# Patient Record
Sex: Female | Born: 1995
Health system: Southern US, Community
[De-identification: ages and names within clinical notes are randomized; demographics above are authoritative.]

## PROBLEM LIST (undated history)

## (undated) DIAGNOSIS — F988 Other specified behavioral and emotional disorders with onset usually occurring in childhood and adolescence: Secondary | ICD-10-CM

## (undated) DIAGNOSIS — J45909 Unspecified asthma, uncomplicated: Secondary | ICD-10-CM

## (undated) DIAGNOSIS — Z8489 Family history of other specified conditions: Secondary | ICD-10-CM

## (undated) DIAGNOSIS — S52181A Other fracture of upper end of right radius, initial encounter for closed fracture: Secondary | ICD-10-CM

## (undated) HISTORY — PX: WISDOM TOOTH EXTRACTION: SHX21

---
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
## 2017-03-27 DIAGNOSIS — Z6823 Body mass index (BMI) 23.0-23.9, adult: Secondary | ICD-10-CM | POA: Diagnosis not present

## 2017-03-27 DIAGNOSIS — Z01419 Encounter for gynecological examination (general) (routine) without abnormal findings: Secondary | ICD-10-CM | POA: Diagnosis not present

## 2017-03-27 DIAGNOSIS — Z113 Encounter for screening for infections with a predominantly sexual mode of transmission: Secondary | ICD-10-CM | POA: Diagnosis not present

## 2017-07-25 MED FILL — metroNIDAZOLE 0.75 % GEL: 0.75 | 5 days supply | Qty: 75 | Fill #0

## 2017-08-07 MED FILL — ADDERALL XR 20 MG CAP SA: 20 | 30 days supply | Qty: 60 | Fill #0

## 2017-09-16 MED FILL — ADDERALL XR 25 MG CAPSULE: 25 | 30 days supply | Qty: 60 | Fill #0

## 2017-12-04 MED FILL — ZOLPIDEM TARTRATE 5 MG TAB: 5 | 30 days supply | Qty: 30 | Fill #0

## 2017-12-04 MED FILL — ADDERALL XR 25 MG CAPSULE: 25 | 30 days supply | Qty: 60 | Fill #0

## 2018-05-06 MED FILL — MYDAYIS ER 25 MG CAPSULE: 25 | 30 days supply | Qty: 30 | Fill #0

## 2018-07-06 MED FILL — MYDAYIS ER 25 MG CAPSULE: 25 | 30 days supply | Qty: 30 | Fill #0

## 2018-09-23 MED FILL — MYDAYIS ER 25 MG CAPSULE: 25 | 30 days supply | Qty: 30 | Fill #0

## 2018-12-07 MED FILL — MYDAYIS ER 37.5 MG CAPSULE: 37.5 | 30 days supply | Qty: 30 | Fill #0

## 2019-02-22 MED FILL — MYDAYIS ER 37.5 MG CAPSULE: 37.5 | 30 days supply | Qty: 30 | Fill #0

## 2019-04-29 MED FILL — MYDAYIS ER 37.5 MG CAPSULE: 37.5 | 30 days supply | Qty: 30 | Fill #0

## 2019-07-19 MED FILL — FLUCONAZOLE 150 MG TABS: 150 | 1 days supply | Qty: 1 | Fill #0

## 2019-07-19 MED FILL — MYDAYIS ER 37.5 MG CAPSULE: 37.5 | 30 days supply | Qty: 30 | Fill #0

## 2019-09-01 MED FILL — MYDAYIS ER 37.5 MG CAPSULE: 37.5 | 30 days supply | Qty: 30 | Fill #0

## 2019-09-10 ENCOUNTER — Encounter (HOSPITAL_BASED_OUTPATIENT_CLINIC_OR_DEPARTMENT_OTHER): Payer: Self-pay | Admitting: Orthopaedic Surgery

## 2019-09-10 ENCOUNTER — Other Ambulatory Visit: Payer: Self-pay | Admitting: Orthopaedic Surgery

## 2019-09-10 ENCOUNTER — Other Ambulatory Visit: Payer: Self-pay

## 2019-09-10 ENCOUNTER — Ambulatory Visit
Admission: RE | Admit: 2019-09-10 | Discharge: 2019-09-10 | Disposition: A | Discharge status: Home / Self Care | Payer: No Typology Code available for payment source | Source: Ambulatory Visit | Attending: Orthopaedic Surgery | Admitting: Orthopaedic Surgery

## 2019-09-10 DIAGNOSIS — M25531 Pain in right wrist: Secondary | ICD-10-CM

## 2019-09-10 NOTE — Progress Notes (Addendum)
Spoke w/ via phone for pre-op interview---[facility] needs dos----     Urine poct         COVID test ------09-11-2019 at 1045 am Arrive at -------1130 am 09-15-2019 No food after midnight, clear liquids until 730 am then npo Medications to take morning of surgery -----none Diabetic medication -----n/a Patient Special Instructions ----- Pre-Op special Istructions ----- Patient verbalized understanding of instructions that were given at this phone interview. Patient denies shortness of breath, chest pain, fever, cough a this phone interview.

## 2019-09-11 ENCOUNTER — Other Ambulatory Visit (HOSPITAL_COMMUNITY)
Admission: RE | Admit: 2019-09-11 | Discharge: 2019-09-11 | Disposition: A | Discharge status: Home / Self Care | Payer: No Typology Code available for payment source | Source: Ambulatory Visit | Attending: Orthopaedic Surgery | Admitting: Orthopaedic Surgery

## 2019-09-11 DIAGNOSIS — Z01812 Encounter for preprocedural laboratory examination: Secondary | ICD-10-CM | POA: Diagnosis present

## 2019-09-11 DIAGNOSIS — Z20822 Contact with and (suspected) exposure to covid-19: Secondary | ICD-10-CM | POA: Insufficient documentation

## 2019-09-11 LAB — SARS CORONAVIRUS 2 (TAT 6-24 HRS): SARS Coronavirus 2: NEGATIVE

## 2019-09-15 ENCOUNTER — Encounter (HOSPITAL_BASED_OUTPATIENT_CLINIC_OR_DEPARTMENT_OTHER)
Admission: RE | Disposition: A | Discharge status: Home / Self Care | Payer: Self-pay | Source: Home / Self Care | Attending: Orthopaedic Surgery

## 2019-09-15 ENCOUNTER — Ambulatory Visit (HOSPITAL_BASED_OUTPATIENT_CLINIC_OR_DEPARTMENT_OTHER): Payer: No Typology Code available for payment source | Admitting: Anesthesiology

## 2019-09-15 ENCOUNTER — Encounter (HOSPITAL_BASED_OUTPATIENT_CLINIC_OR_DEPARTMENT_OTHER): Payer: Self-pay | Admitting: Orthopaedic Surgery

## 2019-09-15 ENCOUNTER — Ambulatory Visit (HOSPITAL_BASED_OUTPATIENT_CLINIC_OR_DEPARTMENT_OTHER)
Admission: RE | Admit: 2019-09-15 | Discharge: 2019-09-15 | Disposition: A | Discharge status: Home / Self Care | Payer: No Typology Code available for payment source | Attending: Orthopaedic Surgery | Admitting: Orthopaedic Surgery

## 2019-09-15 DIAGNOSIS — S52501A Unspecified fracture of the lower end of right radius, initial encounter for closed fracture: Secondary | ICD-10-CM | POA: Diagnosis present

## 2019-09-15 DIAGNOSIS — S52571A Other intraarticular fracture of lower end of right radius, initial encounter for closed fracture: Secondary | ICD-10-CM | POA: Insufficient documentation

## 2019-09-15 DIAGNOSIS — Y9323 Activity, snow (alpine) (downhill) skiing, snow boarding, sledding, tobogganing and snow tubing: Secondary | ICD-10-CM | POA: Diagnosis not present

## 2019-09-15 DIAGNOSIS — Z975 Presence of (intrauterine) contraceptive device: Secondary | ICD-10-CM | POA: Insufficient documentation

## 2019-09-15 DIAGNOSIS — J45909 Unspecified asthma, uncomplicated: Secondary | ICD-10-CM | POA: Diagnosis not present

## 2019-09-15 DIAGNOSIS — W19XXXA Unspecified fall, initial encounter: Secondary | ICD-10-CM | POA: Diagnosis not present

## 2019-09-15 HISTORY — DX: Unspecified asthma, uncomplicated: J45.909

## 2019-09-15 HISTORY — DX: Other specified behavioral and emotional disorders with onset usually occurring in childhood and adolescence: F98.8

## 2019-09-15 HISTORY — DX: Family history of other specified conditions: Z84.89

## 2019-09-15 HISTORY — PX: OPEN REDUCTION INTERNAL FIXATION (ORIF) DISTAL RADIAL FRACTURE: SHX5989

## 2019-09-15 HISTORY — DX: Other fracture of upper end of right radius, initial encounter for closed fracture: S52.181A

## 2019-09-15 LAB — POCT PREGNANCY, URINE: Preg Test, Ur: NEGATIVE

## 2019-09-15 SURGERY — OPEN REDUCTION INTERNAL FIXATION (ORIF) DISTAL RADIUS FRACTURE
Anesthesia: Regional | Site: Wrist | Laterality: Right

## 2019-09-15 MED ORDER — ONDANSETRON HCL 4 MG PO TABS: 4.0000 mg | ORAL_TABLET | Freq: Three times a day (TID) | ORAL | 1 refills | Status: AC | PRN

## 2019-09-15 MED ORDER — FENTANYL CITRATE (PF) 100 MCG/2ML IJ SOLN
100.0000 ug | Freq: Once | INTRAMUSCULAR | Status: AC
  Administered 2019-09-15: 100 ug via INTRAVENOUS
  Filled 2019-09-15: qty 2

## 2019-09-15 MED ORDER — MELOXICAM 7.5 MG PO TABS: 7.5000 mg | ORAL_TABLET | Freq: Every day | ORAL | 2 refills | Status: AC

## 2019-09-15 MED ORDER — MIDAZOLAM HCL 2 MG/2ML IJ SOLN
INTRAMUSCULAR | Status: AC
  Filled 2019-09-15: qty 2

## 2019-09-15 MED ORDER — PROPOFOL 10 MG/ML IV BOLUS
INTRAVENOUS | Status: DC | PRN
  Administered 2019-09-15 (×2): 20 mg via INTRAVENOUS

## 2019-09-15 MED ORDER — MEPERIDINE HCL 25 MG/ML IJ SOLN
6.2500 mg | INTRAMUSCULAR | Status: DC | PRN
  Filled 2019-09-15: qty 1

## 2019-09-15 MED ORDER — MIDAZOLAM HCL 5 MG/5ML IJ SOLN
INTRAMUSCULAR | Status: DC | PRN
  Administered 2019-09-15: 2 mg via INTRAVENOUS

## 2019-09-15 MED ORDER — OXYCODONE HCL 5 MG PO TABS: ORAL_TABLET | ORAL | 0 refills | Status: AC

## 2019-09-15 MED ORDER — PROPOFOL 500 MG/50ML IV EMUL
INTRAVENOUS | Status: DC | PRN
  Administered 2019-09-15: 75 ug/kg/min via INTRAVENOUS

## 2019-09-15 MED ORDER — ACETAMINOPHEN 500 MG PO TABS: 1000.0000 mg | ORAL_TABLET | Freq: Three times a day (TID) | ORAL | 0 refills | Status: AC

## 2019-09-15 MED ORDER — ROPIVACAINE HCL 5 MG/ML IJ SOLN
INTRAMUSCULAR | Status: DC | PRN
  Administered 2019-09-15: 30 mL

## 2019-09-15 MED ORDER — PROMETHAZINE HCL 25 MG/ML IJ SOLN
6.2500 mg | INTRAMUSCULAR | Status: DC | PRN
  Filled 2019-09-15: qty 1

## 2019-09-15 MED ORDER — CEFAZOLIN SODIUM-DEXTROSE 2-4 GM/100ML-% IV SOLN
2.0000 g | INTRAVENOUS | Status: AC
  Administered 2019-09-15: 2 g via INTRAVENOUS
  Filled 2019-09-15: qty 100

## 2019-09-15 MED ORDER — PROPOFOL 10 MG/ML IV BOLUS
INTRAVENOUS | Status: AC
  Filled 2019-09-15: qty 20

## 2019-09-15 MED ORDER — ONDANSETRON HCL 4 MG/2ML IJ SOLN
INTRAMUSCULAR | Status: DC | PRN
  Administered 2019-09-15: 4 mg via INTRAVENOUS

## 2019-09-15 MED ORDER — MIDAZOLAM HCL 2 MG/2ML IJ SOLN
2.0000 mg | Freq: Once | INTRAMUSCULAR | Status: AC
  Administered 2019-09-15: 2 mg via INTRAVENOUS
  Filled 2019-09-15: qty 2

## 2019-09-15 MED ORDER — CLONIDINE HCL (ANALGESIA) 100 MCG/ML EP SOLN
EPIDURAL | Status: DC | PRN
  Administered 2019-09-15: 100 ug

## 2019-09-15 MED ORDER — HYDROMORPHONE HCL 1 MG/ML IJ SOLN
0.2500 mg | INTRAMUSCULAR | Status: DC | PRN
  Filled 2019-09-15: qty 0.5

## 2019-09-15 MED ORDER — FENTANYL CITRATE (PF) 100 MCG/2ML IJ SOLN
INTRAMUSCULAR | Status: AC
  Filled 2019-09-15: qty 2

## 2019-09-15 MED ORDER — ACETAMINOPHEN 10 MG/ML IV SOLN
1000.0000 mg | Freq: Once | INTRAVENOUS | Status: DC | PRN
  Filled 2019-09-15: qty 100

## 2019-09-15 MED ORDER — LACTATED RINGERS IV SOLN
INTRAVENOUS | Status: DC
  Filled 2019-09-15: qty 1000

## 2019-09-15 MED ORDER — CHLORHEXIDINE GLUCONATE 4 % EX LIQD
60.0000 mL | Freq: Once | CUTANEOUS | Status: DC
  Filled 2019-09-15: qty 118

## 2019-09-15 MED ORDER — CEFAZOLIN SODIUM-DEXTROSE 2-4 GM/100ML-% IV SOLN
INTRAVENOUS | Status: AC
  Filled 2019-09-15: qty 100

## 2019-09-15 MED ORDER — HYDROCODONE-ACETAMINOPHEN 7.5-325 MG PO TABS
1.0000 | ORAL_TABLET | Freq: Once | ORAL | Status: DC | PRN
  Filled 2019-09-15: qty 1

## 2019-09-15 MED FILL — ONDANSETRON HCL 4 MG TABLET: 4 | 4 days supply | Qty: 10 | Fill #0

## 2019-09-15 MED FILL — oxyCODONE HCL 5 MG TABS: 5 | 5 days supply | Qty: 30 | Fill #0

## 2019-09-15 MED FILL — MELOXICAM 7.5 MG TABLET: 7.5 | 30 days supply | Qty: 30 | Fill #0

## 2019-09-15 SURGICAL SUPPLY — 73 items
APL PRP STRL LF DISP 70% ISPRP (MISCELLANEOUS)
APL SKNCLS STERI-STRIP NONHPOA (GAUZE/BANDAGES/DRESSINGS)
BANDAGE ACE 3X5.8 VEL STRL LF (GAUZE/BANDAGES/DRESSINGS) ×2 IMPLANT
BENZOIN TINCTURE PRP APPL 2/3 (GAUZE/BANDAGES/DRESSINGS) IMPLANT
BIT DRILL 2.2 SS TIBIAL (BIT) ×1 IMPLANT
BLADE HEX COATED 2.75 (ELECTRODE) IMPLANT
BLADE SURG 15 STRL LF DISP TIS (BLADE) ×1 IMPLANT
BLADE SURG 15 STRL SS (BLADE) ×2
BNDG CMPR 9X4 STRL LF SNTH (GAUZE/BANDAGES/DRESSINGS)
BNDG COHESIVE 4X5 TAN STRL (GAUZE/BANDAGES/DRESSINGS) IMPLANT
BNDG ELASTIC 4X5.8 VLCR STR LF (GAUZE/BANDAGES/DRESSINGS) ×2 IMPLANT
BNDG ESMARK 4X9 LF (GAUZE/BANDAGES/DRESSINGS) IMPLANT
BNDG GAUZE ELAST 4 BULKY (GAUZE/BANDAGES/DRESSINGS) ×1 IMPLANT
BRUSH SCRUB EZ PLAIN DRY (MISCELLANEOUS) ×2 IMPLANT
CANISTER SUCT 1200ML W/VALVE (MISCELLANEOUS) IMPLANT
CHLORAPREP W/TINT 26 (MISCELLANEOUS) ×1 IMPLANT
COVER BACK TABLE 60X90IN (DRAPES) ×2 IMPLANT
COVER WAND RF STERILE (DRAPES) ×2 IMPLANT
CUFF TOURN SGL QUICK 18X4 (TOURNIQUET CUFF) ×1 IMPLANT
DECANTER SPIKE VIAL GLASS SM (MISCELLANEOUS) IMPLANT
DRAPE EXTREMITY T 121X128X90 (DISPOSABLE) ×2 IMPLANT
DRAPE IMP U-DRAPE 54X76 (DRAPES) IMPLANT
DRAPE INCISE IOBAN 66X45 STRL (DRAPES) IMPLANT
DRAPE OEC MINIVIEW 54X84 (DRAPES) IMPLANT
DRAPE SURG 17X23 STRL (DRAPES) ×2 IMPLANT
DRSG EMULSION OIL 3X3 NADH (GAUZE/BANDAGES/DRESSINGS) IMPLANT
DRSG PAD ABDOMINAL 8X10 ST (GAUZE/BANDAGES/DRESSINGS) ×1 IMPLANT
ELECT REM PT RETURN 9FT ADLT (ELECTROSURGICAL) ×2
ELECTRODE REM PT RTRN 9FT ADLT (ELECTROSURGICAL) ×1 IMPLANT
GAUZE SPONGE 4X4 12PLY STRL (GAUZE/BANDAGES/DRESSINGS) ×2 IMPLANT
GAUZE SPONGE 4X4 8PLY STR LF (GAUZE/BANDAGES/DRESSINGS) ×1 IMPLANT
GLOVE BIOGEL PI IND STRL 8 (GLOVE) ×1 IMPLANT
GLOVE BIOGEL PI INDICATOR 8 (GLOVE) ×1
GLOVE ECLIPSE 8.0 STRL XLNG CF (GLOVE) ×2 IMPLANT
GOWN STRL REUS W/ TWL LRG LVL3 (GOWN DISPOSABLE) ×1 IMPLANT
GOWN STRL REUS W/ TWL XL LVL3 (GOWN DISPOSABLE) ×1 IMPLANT
GOWN STRL REUS W/TWL LRG LVL3 (GOWN DISPOSABLE) ×2
GOWN STRL REUS W/TWL XL LVL3 (GOWN DISPOSABLE) ×2
K-WIRE 1.6 (WIRE) ×4
K-WIRE FX5X1.6XNS BN SS (WIRE) ×2
KWIRE FX5X1.6XNS BN SS (WIRE) IMPLANT
NDL HYPO 25X1 1.5 SAFETY (NEEDLE) IMPLANT
NEEDLE HYPO 25X1 1.5 SAFETY (NEEDLE) IMPLANT
NS IRRIG 1000ML POUR BTL (IV SOLUTION) IMPLANT
PACK BASIN DAY SURGERY FS (CUSTOM PROCEDURE TRAY) ×2 IMPLANT
PAD CAST 4YDX4 CTTN HI CHSV (CAST SUPPLIES) ×1 IMPLANT
PADDING CAST COTTON 4X4 STRL (CAST SUPPLIES) ×2
PEG LOCKING SMOOTH 2.2X16 (Screw) ×1 IMPLANT
PEG LOCKING SMOOTH 2.2X18 (Peg) ×1 IMPLANT
PEG LOCKING SMOOTH 2.2X20 (Screw) ×2 IMPLANT
PEG LOCKING SMOOTH 2.2X22 (Screw) ×1 IMPLANT
PENCIL BUTTON HOLSTER BLD 10FT (ELECTRODE) ×2 IMPLANT
PLATE NARROW DVR RIGHT (Plate) ×1 IMPLANT
SCREW LOCK 14X2.7X 3 LD TPR (Screw) IMPLANT
SCREW LOCKING 2.7X13MM (Screw) ×1 IMPLANT
SCREW LOCKING 2.7X14 (Screw) ×4 IMPLANT
SCREW LP NL 2.7X22MM (Screw) ×1 IMPLANT
SPLINT PLASTER CAST XFAST 3X15 (CAST SUPPLIES) ×10 IMPLANT
SPLINT PLASTER XTRA FASTSET 3X (CAST SUPPLIES) ×10
SPONGE LAP 4X18 RFD (DISPOSABLE) ×2 IMPLANT
STOCKINETTE 4X48 STRL (DRAPES) ×2 IMPLANT
STRIP CLOSURE SKIN 1/2X4 (GAUZE/BANDAGES/DRESSINGS) ×1 IMPLANT
SUCTION FRAZIER HANDLE 10FR (MISCELLANEOUS) ×2
SUCTION TUBE FRAZIER 10FR DISP (MISCELLANEOUS) IMPLANT
SUT MNCRL AB 4-0 PS2 18 (SUTURE) ×1 IMPLANT
SUT VIC AB 3-0 SH 27 (SUTURE) ×2
SUT VIC AB 3-0 SH 27X BRD (SUTURE) IMPLANT
SYR BULB 3OZ (MISCELLANEOUS) ×2 IMPLANT
SYR CONTROL 10ML LL (SYRINGE) ×2 IMPLANT
TOWEL OR 17X26 10 PK STRL BLUE (TOWEL DISPOSABLE) ×2 IMPLANT
TRAY DSU PREP LF (CUSTOM PROCEDURE TRAY) IMPLANT
TUBE CONNECTING 12X1/4 (SUCTIONS) IMPLANT
YANKAUER SUCT BULB TIP NO VENT (SUCTIONS) IMPLANT

## 2019-09-15 NOTE — Transfer of Care (Signed)
Immediate Anesthesia Transfer of Care Note  Patient: Tanya Howard  Procedure(s) Performed: OPEN REDUCTION INTERNAL FIXATION (ORIF) DISTAL RADIAL FRACTURE (Right Wrist)  Patient Location: PACU  Anesthesia Type:MAC combined with regional for post-op pain  Level of Consciousness: awake, oriented, patient cooperative and responds to stimulation  Airway & Oxygen Therapy: Patient Spontanous Breathing and Patient connected to nasal cannula oxygen  Post-op Assessment: Report given to RN and Post -op Vital signs reviewed and stable  Post vital signs: Reviewed and stable  Last Vitals:  Vitals Value Taken Time  BP 99/55 09/15/19 1539  Temp 36.5 C 09/15/19 1539  Pulse 53 09/15/19 1540  Resp 12 09/15/19 1540  SpO2 100 % 09/15/19 1540  Vitals shown include unvalidated device data.  Last Pain:  Vitals:   09/15/19 1330  TempSrc:   PainSc: 0-No pain      Patients Stated Pain Goal: 4 (59/47/07 6151)  Complications: No apparent anesthesia complications

## 2019-09-15 NOTE — Anesthesia Postprocedure Evaluation (Signed)
Anesthesia Post Note  Patient: Tanya Howard  Procedure(s) Performed: OPEN REDUCTION INTERNAL FIXATION (ORIF) DISTAL RADIAL FRACTURE (Right Wrist)     Patient location during evaluation: PACU Anesthesia Type: Regional Level of consciousness: awake and alert Pain management: pain level controlled Vital Signs Assessment: post-procedure vital signs reviewed and stable Respiratory status: spontaneous breathing, nonlabored ventilation, respiratory function stable and patient connected to nasal cannula oxygen Cardiovascular status: stable and blood pressure returned to baseline Postop Assessment: no apparent nausea or vomiting Anesthetic complications: no    Last Vitals:  Vitals:   09/15/19 1330 09/15/19 1539  BP: (!) 103/55 (!) 99/55  Pulse: (!) 55 68  Resp: 15 15  Temp:  36.5 C  SpO2: 100% 100%    Last Pain:  Vitals:   09/15/19 1539  TempSrc:   PainSc: 0-No pain                 Trevor Iha

## 2019-09-15 NOTE — Op Note (Signed)
Orthopaedic Surgery Operative Note (CSN: [id number])  Tanya Howard  March 28, 1996 Date of Surgery: 09/15/2019   Diagnoses:  RIGHT DISTAL RADIUS FRACTURE  Procedure: Right distal radius fracture open reduction internal fixation intra-articular less than 3 pieces   Operative Finding Successful completion of the planned procedure.  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  Overall case went routine, no obvious bleeding at the end of the case and good palpable pulse noted.  Post-operative plan: The patient will be nonweightbearing in a splint for a week transition to a removable splint for likely 3 weeks and out full weightbearing to tolerance at that point.  If the patient is doing quite well she could actually go back to weightbearing to tolerance in her brace at week 3 we will discuss this further.  The patient will be discharged home.  DVT prophylaxis not indicated in this ambulatory upper extremity patient without significant risk factors.   Pain control with PRN pain medication preferring oral medicines.  Follow up plan will be scheduled in approximately 7 days for incision check and XR.  Post-Op Diagnosis: Same Surgeons:Primary: Hiram Gash, MD Assistants:Caroline McBane PA-C Location: [facility] OR ROOM 3 Anesthesia: General with regional anesthesia Antibiotics: Ancef 2 g with local vancomycin powder 1 g at the surgical site Tourniquet time:  Total Tourniquet Time Documented: Upper Arm (Right) - 20 minutes Total: Upper Arm (Right) - 20 minutes  Estimated Blood Loss: Minimal Complications: None Specimens: None Implants: Implant Name Type Inv. Item Serial No. Manufacturer Lot No. LRB No. Used Action  PLATE NARROW DVR RIGHT - [id number] Plate PLATE NARROW DVR RIGHT  ZIMMER  RECON(ORTH,TRAU,BIO,SG)  Right 1 Implanted    Indications for Surgery:   Tanya Howard is a 24 y.o. female with fall whilst snowboarding resulting in a intra-articular distal radius fracture with minimal displacement on CT scan.  We did have a long discussion about her options including casting which would have at least kept her out of work for 6 weeks and likely more along the lines of 8-10 after she was able to get her motion back versus operative fixation which may return to work around 3 to 4 weeks.  She like to proceed with surgery.  She understood the risks and increased possibility of infection, nerve damage or vessel damage, CRPS with operative versus nonoperative treatment.    Procedure:   The patient was identified properly. Informed consent was obtained and the surgical site was marked. The patient was taken up to suite where general anesthesia was induced.  The patient was positioned supine on a hand table.  The right wrist was prepped and draped in the usual sterile fashion.  Timeout was performed before the beginning of the case.  Tourniquet was used for the above duration.  An FCR approach was made exposing the volar surface of the distal radius taking care to go through the sheath of the FCR tendon tract and ulnarly exposing the inferior portion of the sheath while protecting the median nerve and radial artery on each side with blunt retractors.  This inferior portion of the sheath was incised sharply and examined for presence of the palmar cutaneous branch of the median nerve.  It was determined to not be within the field and we carried our dissection deeply to the bone  splitting the pronator quadratus and exposing the fracture site.    Appropriate reduction was obtained and a narrow DVR Biomet plate was placed and checked for sizing and reduction under fluoroscopy.  This reduction was held in place with K wires and a K wire was placed into the radial styloid.    Once  appropriate reduction was confirmed we then proceeded to fix the plate proximally and then proceeded to fill the distal holes with a combination of partially threaded screws and pegs.  At this point we checked our reduction to ensure that there was no intra-articular extension of our screws.  Once this was confirmed we proceeded to fill remaining 3 proximal shaft screws and obtained final images which demonstrated appropriate reduction and maintenance of alignment.  The DRUJ was checked and found to be stable.  We verified that all fast guides were removed on XR and through count.    The wound was thoroughly irrigated.  The tourniquet was released prior to skin closure to verify there was no excessive bleeding and we visualized that the radial artery and median nerve were intact at the end of the case. The PQ was reapproximated grossly prior to skin closure.     We irrigated the wound copiously before placing local antibiotic as listed above.  Close the incision in a multilayer fashion with absorbable suture.  Sterile dressing was placed.  A demisplint was placed.  Patient was awoken taken to PACU in stable condition.  Alfonse Alpers, PA-C, present and scrubbed throughout the case, critical for completion in a timely fashion, and for retraction, instrumentation, closure.

## 2019-09-15 NOTE — Anesthesia Preprocedure Evaluation (Addendum)
Anesthesia Evaluation  Patient identified by MRN, date of birth, ID band Patient awake    Reviewed: Allergy & Precautions, NPO status , Patient's Chart, lab work & pertinent test results  History of Anesthesia Complications (+) Family history of anesthesia reaction  Airway Mallampati: I  TM Distance: >3 FB Neck ROM: Full    Dental no notable dental hx. (+) Teeth Intact, Dental Advisory Given   Pulmonary    Pulmonary exam normal breath sounds clear to auscultation       Cardiovascular Exercise Tolerance: Good Normal cardiovascular exam Rhythm:Regular Rate:Normal     Neuro/Psych negative neurological ROS     GI/Hepatic negative GI ROS, Neg liver ROS,   Endo/Other  negative endocrine ROS  Renal/GU negative Renal ROS     Musculoskeletal negative musculoskeletal ROS (+)   Abdominal   Peds  Hematology   Anesthesia Other Findings   Reproductive/Obstetrics negative OB ROS                           Anesthesia Physical Anesthesia Plan  ASA: I  Anesthesia Plan: Regional   Post-op Pain Management:    Induction: Intravenous  PONV Risk Score and Plan: 3 and Treatment may vary due to age or medical condition, Ondansetron and Midazolam  Airway Management Planned: Nasal Cannula and Natural Airway  Additional Equipment:   Intra-op Plan:   Post-operative Plan:   Informed Consent: I have reviewed the patients History and Physical, chart, labs and discussed the procedure including the risks, benefits and alternatives for the proposed anesthesia with the patient or authorized representative who has indicated his/her understanding and acceptance.     Dental advisory given  Plan Discussed with: CRNA  Anesthesia Plan Comments: (ORIF of R radial fx.  Under R supra clavicular block + MAC)       Anesthesia Quick Evaluation

## 2019-09-15 NOTE — Discharge Instructions (Signed)

## 2019-09-15 NOTE — Anesthesia Procedure Notes (Addendum)
Anesthesia Regional Block: Supraclavicular block   Pre-Anesthetic Checklist: ,, timeout performed, Correct Patient, Correct Site, Correct Laterality, Correct Procedure, Correct Position, site marked, Risks and benefits discussed,  Surgical consent,  Pre-op evaluation,  At surgeon's request and post-op pain management  Laterality: Right  Prep: chloraprep       Needles:  Injection technique: Single-shot  Needle Type: Echogenic Needle     Needle Length: 5cm  Needle Gauge: 21     Additional Needles:   Procedures:,,,, ultrasound used (permanent image in chart),,,,  Narrative:  Start time: 09/15/2019 12:15 PM End time: 09/15/2019 12:25 PM Injection made incrementally with aspirations every 5 mL.  Performed by: Personally  Anesthesiologist: Trevor Iha, MD  Additional Notes: Pt tolerated procedure well

## 2019-09-15 NOTE — Progress Notes (Signed)
Assisted Dr. Richardson Landry with right, supraclavicular block. Side rails up, monitors on throughout procedure. See vital signs in flow sheet. Tolerated Procedure well.

## 2019-09-15 NOTE — H&P (Signed)
PREOPERATIVE H&P  Chief Complaint: RIGHT DISTAL RADIUS FRACTURE  HPI: Tanya Howard is a 24 y.o. female who is scheduled for OPEN REDUCTION INTERNAL FIXATION (ORIF) DISTAL RADIAL FRACTURE.   Patient sustained a right distal radius fracture.   Her symptoms are rated as moderate to severe, and have been worsening.  This is significantly impairing activities of daily living.    Please see clinic note for further details on this patient's care.    She has elected for surgical management.   Past Medical History:  Diagnosis Date  . ADD (attention deficit disorder)   . Asthma    childhood asthma  . Family history of adverse reaction to anesthesia    grandfather ponv  . Other fracture of upper end of right radius, initial encounter for closed fracture    Past Surgical History:  Procedure Laterality Date  . WISDOM TOOTH EXTRACTION     Social History   Socioeconomic History  . Marital status: Single    Spouse name: Not on file  . Number of children: Not on file  . Years of education: Not on file  . Highest education level: Not on file  Occupational History  . Not on file  Tobacco Use  . Smoking status: Never Smoker  . Smokeless tobacco: Never Used  Substance and Sexual Activity  . Alcohol use: Yes    Comment: occ  . Drug use: Never  . Sexual activity: Not on file  Other Topics Concern  . Not on file  Social History Narrative  . Not on file   Social Determinants of Health   Financial Resource Strain:   . Difficulty of Paying Living Expenses:   Food Insecurity:   . Worried About Charity fundraiser in the Last Year:   . Arboriculturist in the Last Year:   Transportation Needs:   . Film/video editor (Medical):   Marland Kitchen Lack of Transportation (Non-Medical):   Physical Activity:   . Days of Exercise per Week:   . Minutes of Exercise per Session:   Stress:   . Feeling of Stress :   Social Connections:   . Frequency of Communication with Friends and  Family:   . Frequency of Social Gatherings with Friends and Family:   . Attends Religious Services:   . Active Member of Clubs or Organizations:   . Attends Archivist Meetings:   Marland Kitchen Marital Status:    History reviewed. No pertinent family history. No Known Allergies Prior to Admission medications   Medication Sig Start Date End Date Taking? Authorizing Provider  acetaminophen (TYLENOL) 500 MG tablet Take 1,000 mg by mouth every 6 (six) hours as needed.   Yes [provider]  calcium gluconate 500 MG tablet Take 1 tablet by mouth daily. Takes 2 tabs   Yes [provider]  cholecalciferol (VITAMIN D3) 25 MCG (1000 UNIT) tablet Take 1,000 Units by mouth daily. Takes 2 tabs   Yes [provider]  UNABLE TO FIND 1 scoop collagen powder daily   Yes [provider]  Amphet-Dextroamphet 3-Bead ER (MYDAYIS) 37.5 MG CP24 Take by mouth. Takes when working    [provider]  levonorgestrel (MIRENA) 20 MCG/24HR IUD 1 each by Intrauterine route once.    [provider]    ROS: All other systems have been reviewed and were otherwise negative with the exception of those mentioned in the HPI and as above.  Physical Exam: General: Alert, no acute  distress Cardiovascular: No pedal edema Respiratory: No cyanosis, no use of accessory musculature GI: No organomegaly, abdomen is soft and non-tender Skin: No lesions in the area of chief complaint Neurologic: Sensation intact distally Psychiatric: Patient is competent for consent with normal mood and affect Lymphatic: No axillary or cervical lymphadenopathy  MUSCULOSKELETAL:  Right upper extremity: Splint CDI. Skin intact though cannot assess fully beneath splint. Nontender to palpation proximally, with full and painless ROM throughout hand with DPC of 0. + Motor in  AIN, PIN, Ulnar distributions. Sensation intact in medial, radial, and ulnar distributions. Well perfused digits.   Imaging: CT  of right wrist: Acute nondisplaced intra-articular fracture of the distal radius.  Assessment: RIGHT DISTAL RADIUS FRACTURE  Plan: Plan for Procedure(s): OPEN REDUCTION INTERNAL FIXATION (ORIF) DISTAL RADIAL FRACTURE  The risks benefits and alternatives were discussed with the patient including but not limited to the risks of nonoperative treatment, versus surgical intervention including infection, bleeding, nerve injury,  blood clots, cardiopulmonary complications, morbidity, mortality, among others, and they were willing to proceed.   The patient acknowledged the explanation, agreed to proceed with the plan and consent was signed.   Operative Plan: ORIF right distal radius fracture Discharge Medications: Standard   Vernetta Honey, PA-C  09/15/2019 12:17 PM

## 2019-09-15 NOTE — H&P (Deleted)
PREOPERATIVE H&P  Chief Complaint: RIGHT DISTAL RADIUS FRACTURE  HPI: Tanya Howard is a 24 y.o. female who presents for preoperative history and physical with a diagnosis of RIGHT DISTAL RADIUS FRACTURE. Symptoms are rated as moderate to severe, and have been worsening.  This is significantly impairing activities of daily living.  Please see my clinic note for full details on this patient's care.  She has elected for surgical management.   Past Medical History:  Diagnosis Date  . ADD (attention deficit disorder)   . Asthma    childhood asthma  . Family history of adverse reaction to anesthesia    grandfather ponv  . Other fracture of upper end of right radius, initial encounter for closed fracture    Past Surgical History:  Procedure Laterality Date  . WISDOM TOOTH EXTRACTION     Social History   Socioeconomic History  . Marital status: Single    Spouse name: Not on file  . Number of children: Not on file  . Years of education: Not on file  . Highest education level: Not on file  Occupational History  . Not on file  Tobacco Use  . Smoking status: Never Smoker  . Smokeless tobacco: Never Used  Substance and Sexual Activity  . Alcohol use: Yes    Comment: occ  . Drug use: Never  . Sexual activity: Not on file  Other Topics Concern  . Not on file  Social History Narrative  . Not on file   Social Determinants of Health   Financial Resource Strain:   . Difficulty of Paying Living Expenses:   Food Insecurity:   . Worried About Programme researcher, broadcasting/film/video in the Last Year:   . Barista in the Last Year:   Transportation Needs:   . Freight forwarder (Medical):   Marland Kitchen Lack of Transportation (Non-Medical):   Physical Activity:   . Days of Exercise per Week:   . Minutes of Exercise per Session:   Stress:   . Feeling of Stress :   Social Connections:   . Frequency of Communication with Friends and Family:   . Frequency of Social Gatherings with Friends and  Family:   . Attends Religious Services:   . Active Member of Clubs or Organizations:   . Attends Banker Meetings:   Marland Kitchen Marital Status:    History reviewed. No pertinent family history. No Known Allergies Prior to Admission medications   Medication Sig Start Date End Date Taking? Authorizing Provider  acetaminophen (TYLENOL) 500 MG tablet Take 1,000 mg by mouth every 6 (six) hours as needed.   Yes [provider]  calcium gluconate 500 MG tablet Take 1 tablet by mouth daily. Takes 2 tabs   Yes [provider]  cholecalciferol (VITAMIN D3) 25 MCG (1000 UNIT) tablet Take 1,000 Units by mouth daily. Takes 2 tabs   Yes [provider]  UNABLE TO FIND 1 scoop collagen powder daily   Yes [provider]  Amphet-Dextroamphet 3-Bead ER (MYDAYIS) 37.5 MG CP24 Take by mouth. Takes when working    [provider]  levonorgestrel (MIRENA) 20 MCG/24HR IUD 1 each by Intrauterine route once.    [provider]     Positive ROS: All other systems have been reviewed and were otherwise negative with the exception of those mentioned in the HPI and as above.  Physical Exam: General: Alert, no acute distress Cardiovascular: No pedal edema Respiratory: No cyanosis, no use of accessory  musculature GI: No organomegaly, abdomen is soft and non-tender Skin: No lesions in the area of chief complaint Neurologic: Sensation intact distally Psychiatric: Patient is competent for consent with normal mood and affect Lymphatic: No axillary or cervical lymphadenopathy  MUSCULOSKELETAL: R wrist, wwp hand, DPC 0, skin intact, ROM deferred insetting of known fracture  Assessment: RIGHT DISTAL RADIUS FRACTURE  Plan: Plan for Procedure(s): OPEN REDUCTION INTERNAL FIXATION (ORIF) DISTAL RADIAL FRACTURE  The risks benefits and alternatives were discussed with the patient including but not limited to the risks of nonoperative treatment, versus surgical  intervention including infection, bleeding, nerve injury,  blood clots, cardiopulmonary complications, morbidity, mortality, among others, and they were willing to proceed.   Hiram Gash, MD  09/15/2019 12:16 PM

## 2019-09-15 NOTE — Interval H&P Note (Signed)
History and Physical Interval Note:  09/15/2019 12:19 PM  Tanya Howard  has presented today for surgery, with the diagnosis of RIGHT DISTAL RADIUS FRACTURE.  The various methods of treatment have been discussed with the patient and family. After consideration of risks, benefits and other options for treatment, the patient has consented to  Procedure(s): OPEN REDUCTION INTERNAL FIXATION (ORIF) DISTAL RADIAL FRACTURE (Right) as a surgical intervention.  The patient's history has been reviewed, patient examined, no change in status, stable for surgery.  I have reviewed the patient's chart and labs.  Questions were answered to the patient's satisfaction.     Bjorn Pippin

## 2019-09-16 NOTE — Addendum Note (Signed)
Addendum  created 09/16/19 1106 by Jessica Priest, CRNA   Charge Capture section accepted

## 2020-01-20 MED FILL — MYDAYIS ER 50 MG CAPSULE: 50 | 30 days supply | Qty: 30 | Fill #0

## 2020-03-24 MED FILL — MYDAYIS ER 50 MG CAPSULE: 50 | 30 days supply | Qty: 30 | Fill #0

## 2020-05-03 ENCOUNTER — Other Ambulatory Visit (HOSPITAL_COMMUNITY): Payer: Self-pay | Admitting: Family Medicine

## 2020-05-03 MED FILL — MYDAYIS ER 50 MG CAPSULE: 50 | 30 days supply | Qty: 30 | Fill #0

## 2020-09-21 ENCOUNTER — Other Ambulatory Visit (HOSPITAL_COMMUNITY): Payer: Self-pay | Admitting: Family Medicine

## 2020-11-06 DIAGNOSIS — Z113 Encounter for screening for infections with a predominantly sexual mode of transmission: Secondary | ICD-10-CM | POA: Diagnosis not present

## 2020-11-06 DIAGNOSIS — Z6822 Body mass index (BMI) 22.0-22.9, adult: Secondary | ICD-10-CM | POA: Diagnosis not present

## 2020-11-06 DIAGNOSIS — Z01419 Encounter for gynecological examination (general) (routine) without abnormal findings: Secondary | ICD-10-CM | POA: Diagnosis not present

## 2020-11-08 IMAGING — CT CT WRIST*R* W/O CM
4 of 6 series · 17 of 36 positions shown, 19 images · non-contrast
Comparison: None.

CLINICAL DATA: Right wrist pain after snowboarding injury. Evaluate
for fracture.

EXAM:
CT OF THE RIGHT WRIST WITHOUT CONTRAST
TECHNIQUE: Multidetector CT imaging of the right wrist was performed according
to the standard protocol. Multiplanar CT image reconstructions were
also generated.

[Series 3: wrist 1.50 br60 s3 axial bone hd fov · sagittal · 0.29mm/px · 6 of 143 slices shown]
[im 24/143  bone]
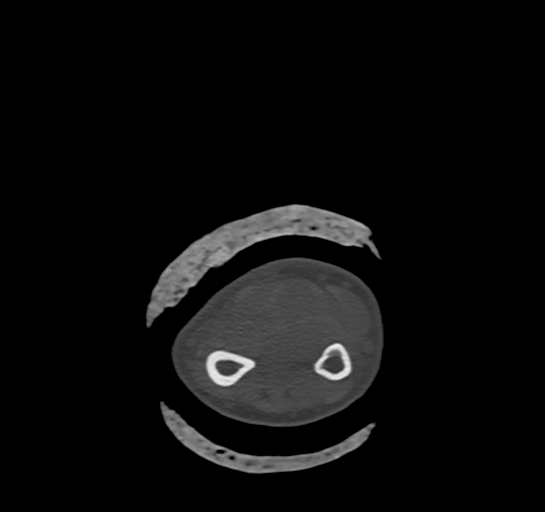
[im 34/143  soft-tissue]
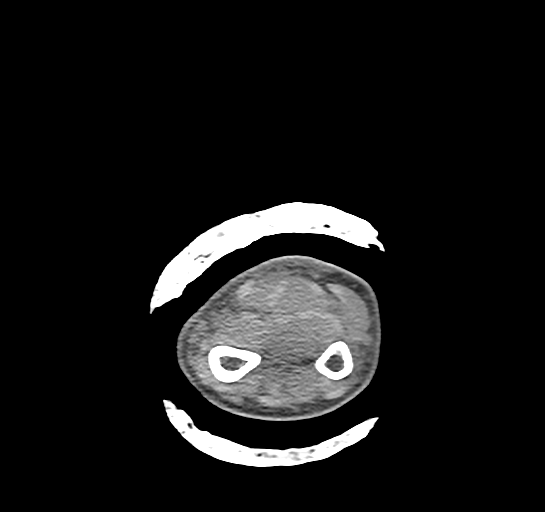
[im 48/143  bone]
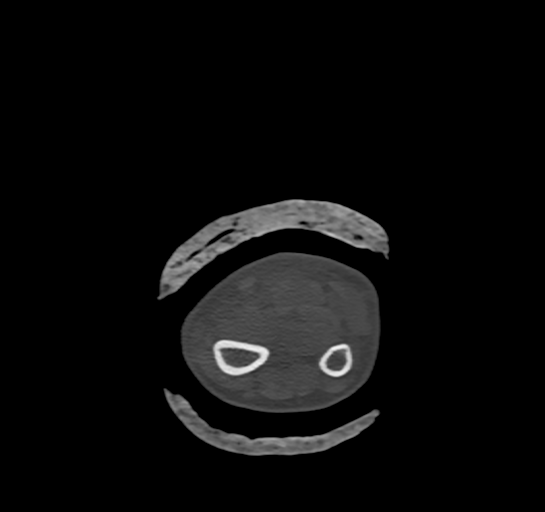
[im 72/143  bone]
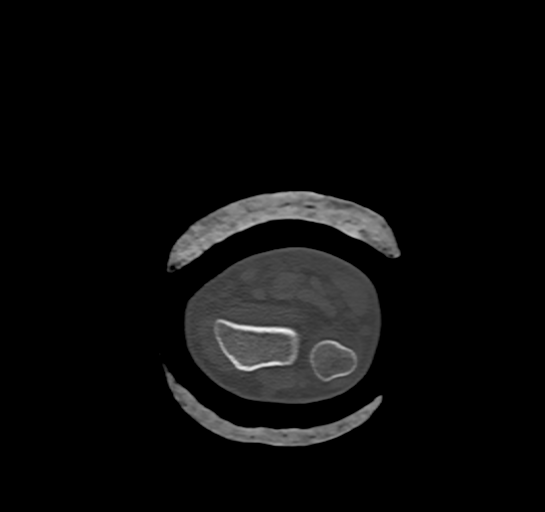
[im 95/143  bone]
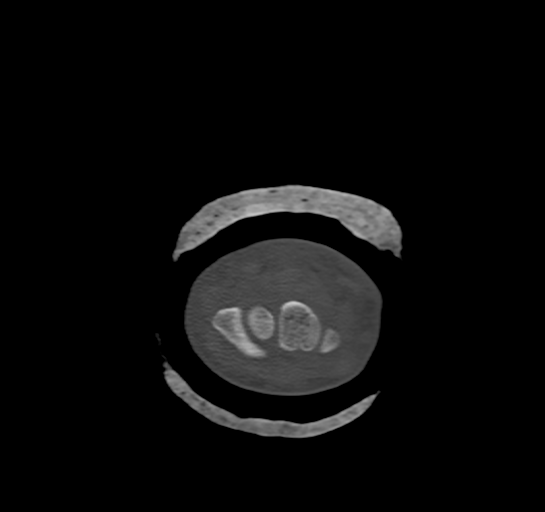
[im 119/143  bone]
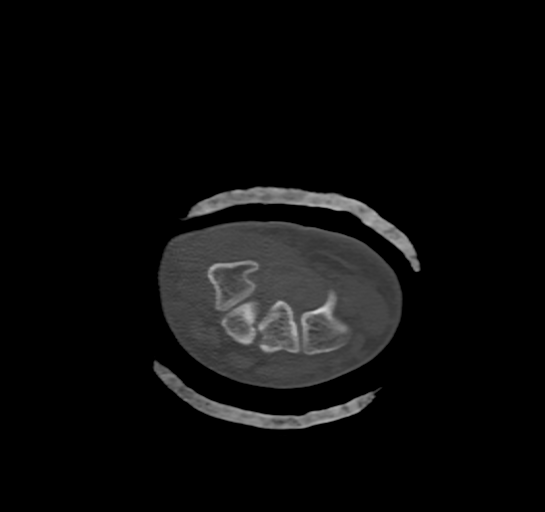

[Series 1006: sag bone · axial · 0.30mm/px · z∈[-784,-722]mm · 5 of 48 slices shown, 7 images]
[im 8/48  soft-tissue]
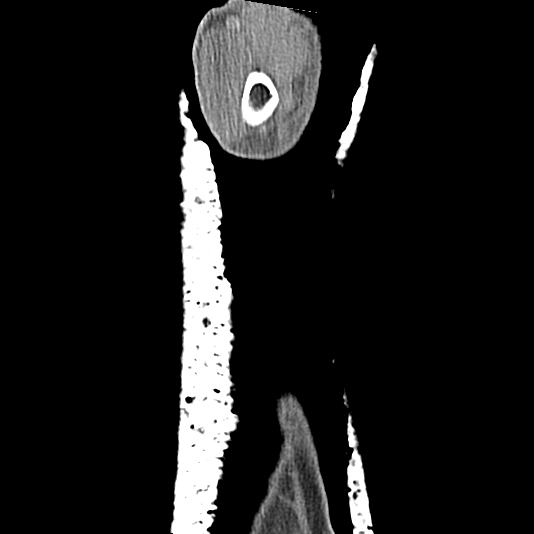
[im 8/48  bone]
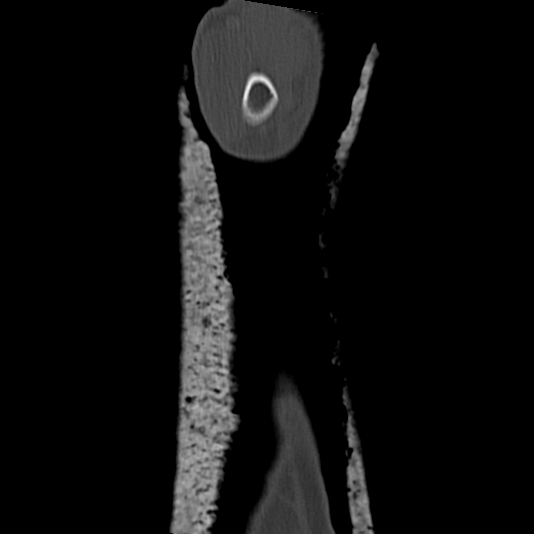
[im 16/48  bone]
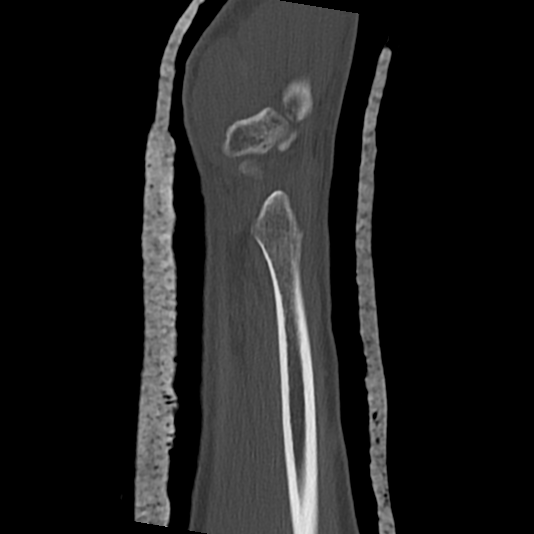
[im 24/48  bone]
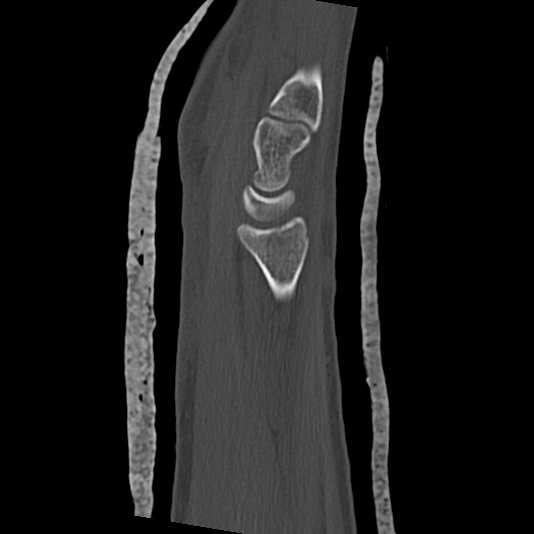
[im 32/48  bone]
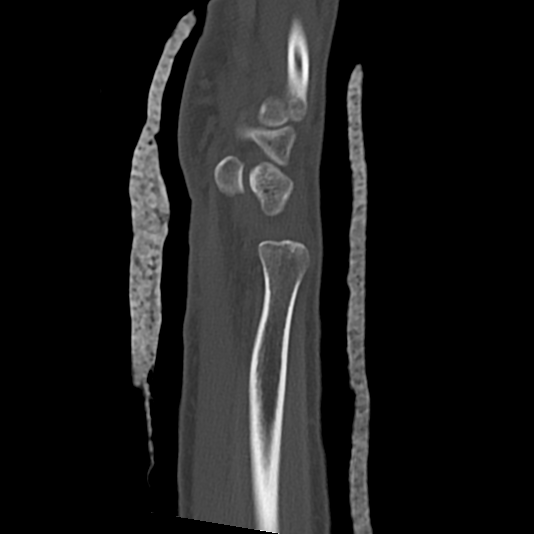
[im 40/48  soft-tissue]
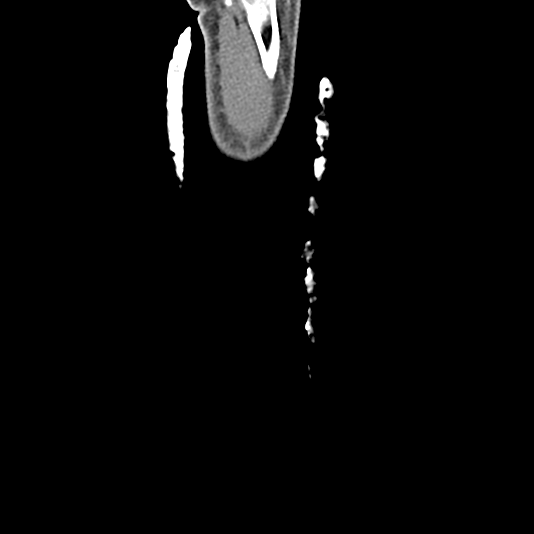
[im 40/48  bone]
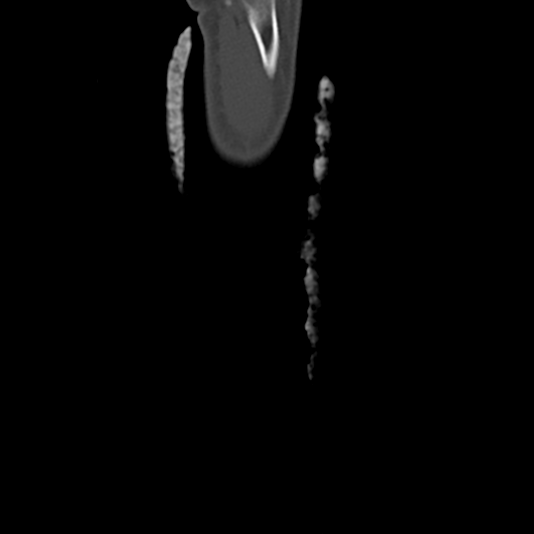

[Series 1011: cor soft · coronal · 0.30mm/px · 3 of 81 slices shown]
[im 30/81  bone]
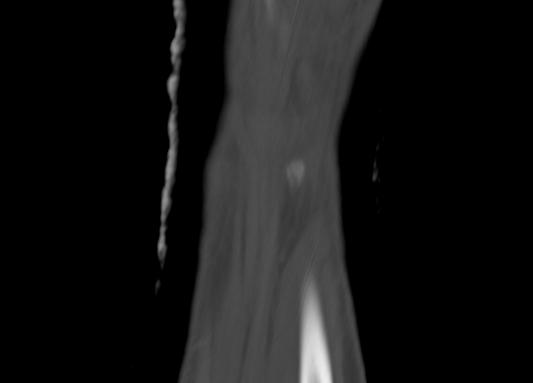
[im 55/81  bone]
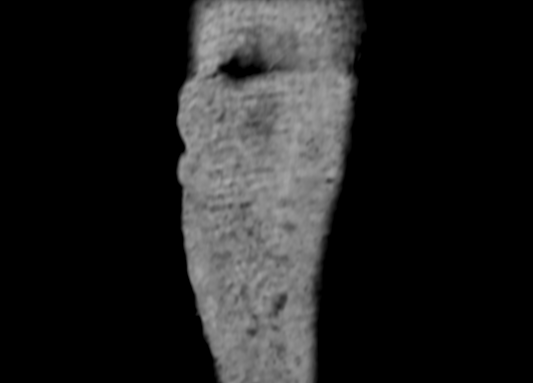
[im 80/81  bone]
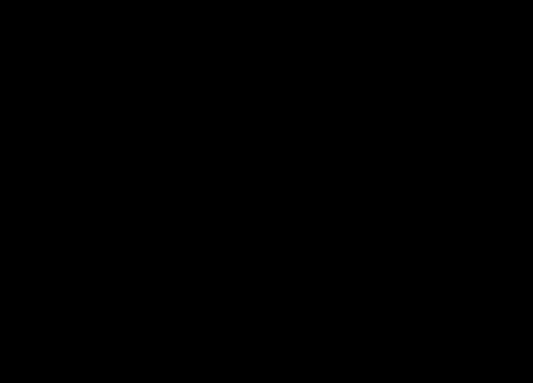

[Series 1013: sag soft · axial · 0.30mm/px · z∈[-795,-767]mm · 3 of 44 slices shown]
[im 8/44  soft-tissue]
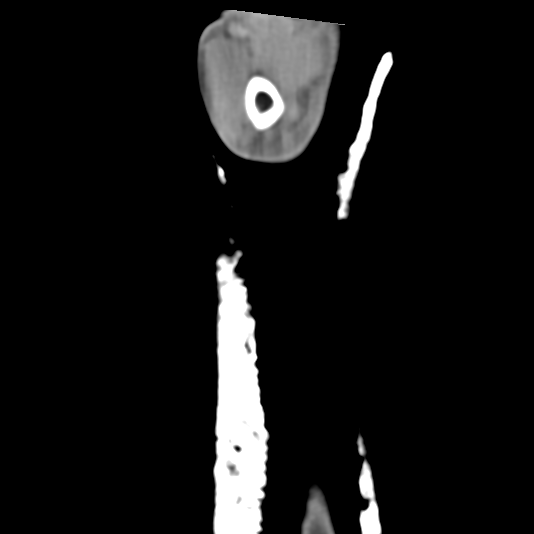
[im 15/44  soft-tissue]
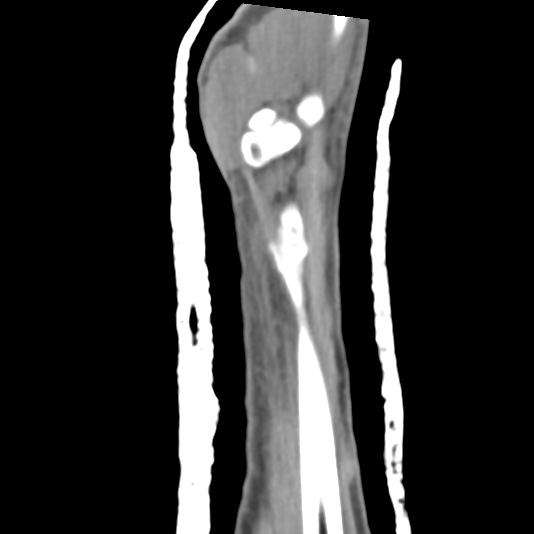
[im 22/44  soft-tissue]
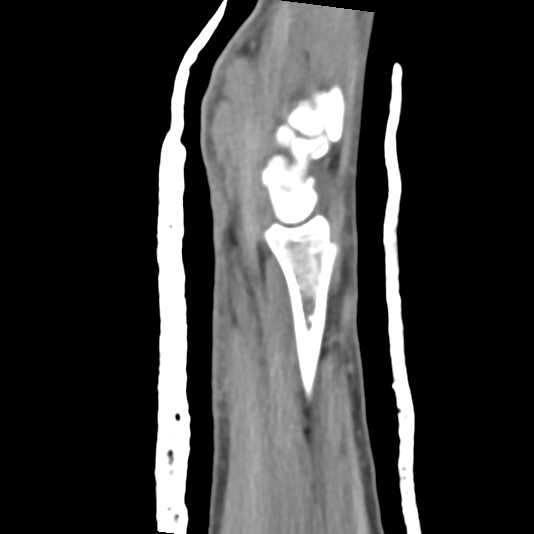

[17 of 36 positions shown; findings below may reference images not displayed]

FINDINGS: Bones/Joint/Cartilage

Acute nondisplaced predominantly longitudinal intra-articular
fracture of the distal radius. No additional fracture. No
dislocation. Joint spaces are preserved. No joint effusion.

Ligaments

Ligaments are suboptimally evaluated by CT.

Muscles and Tendons
Grossly intact.

Soft tissue
Mild soft tissue swelling about the wrist. No fluid collection or
hematoma. No soft tissue mass.
IMPRESSION: Acute nondisplaced intra-articular fracture of the distal radius.

## 2020-11-23 ENCOUNTER — Other Ambulatory Visit (HOSPITAL_COMMUNITY): Payer: Self-pay

## 2020-11-23 MED ORDER — MYDAYIS 50 MG PO CP24
1.0000 | ORAL_CAPSULE | Freq: Every day | ORAL | 0 refills | Status: AC
  Filled 2020-11-23: qty 30, 30d supply, fill #0

## 2020-12-28 DIAGNOSIS — R87612 Low grade squamous intraepithelial lesion on cytologic smear of cervix (LGSIL): Secondary | ICD-10-CM | POA: Diagnosis not present

## 2020-12-28 DIAGNOSIS — R87810 Cervical high risk human papillomavirus (HPV) DNA test positive: Secondary | ICD-10-CM | POA: Diagnosis not present

## 2020-12-28 DIAGNOSIS — N870 Mild cervical dysplasia: Secondary | ICD-10-CM | POA: Diagnosis not present

## 2020-12-28 DIAGNOSIS — N879 Dysplasia of cervix uteri, unspecified: Secondary | ICD-10-CM | POA: Diagnosis not present

## 2021-02-08 ENCOUNTER — Other Ambulatory Visit (HOSPITAL_COMMUNITY): Payer: Self-pay

## 2021-02-08 MED ORDER — MYDAYIS 50 MG PO CP24
1.0000 | ORAL_CAPSULE | Freq: Every day | ORAL | 0 refills | Status: DC
  Filled 2021-02-08: qty 30, 30d supply, fill #0

## 2021-02-13 ENCOUNTER — Other Ambulatory Visit (HOSPITAL_COMMUNITY): Payer: Self-pay

## 2021-04-06 ENCOUNTER — Other Ambulatory Visit (HOSPITAL_COMMUNITY): Payer: Self-pay

## 2021-04-06 MED ORDER — MYDAYIS 50 MG PO CP24
50.0000 mg | ORAL_CAPSULE | Freq: Every day | ORAL | 0 refills | Status: DC
  Filled 2021-04-06: qty 30, 30d supply, fill #0

## 2021-04-16 ENCOUNTER — Other Ambulatory Visit (HOSPITAL_COMMUNITY): Payer: Self-pay

## 2021-05-22 ENCOUNTER — Other Ambulatory Visit (HOSPITAL_COMMUNITY): Payer: Self-pay

## 2021-05-22 DIAGNOSIS — F900 Attention-deficit hyperactivity disorder, predominantly inattentive type: Secondary | ICD-10-CM | POA: Diagnosis not present

## 2021-05-22 MED ORDER — MYDAYIS 50 MG PO CP24: 1.0000 | ORAL_CAPSULE | ORAL | 0 refills | Status: AC

## 2021-05-22 MED ORDER — MYDAYIS 50 MG PO CP24: 1.0000 | ORAL_CAPSULE | Freq: Every day | ORAL | 0 refills | Status: AC

## 2021-05-22 MED ORDER — MYDAYIS 50 MG PO CP24
1.0000 | ORAL_CAPSULE | ORAL | 0 refills | Status: AC
  Filled 2021-05-22 – 2021-06-21 (×2): qty 30, 30d supply, fill #0

## 2021-05-30 ENCOUNTER — Other Ambulatory Visit (HOSPITAL_COMMUNITY): Payer: Self-pay

## 2021-05-31 ENCOUNTER — Other Ambulatory Visit (HOSPITAL_COMMUNITY): Payer: Self-pay

## 2021-06-21 ENCOUNTER — Other Ambulatory Visit (HOSPITAL_COMMUNITY): Payer: Self-pay

## 2021-08-10 ENCOUNTER — Other Ambulatory Visit (HOSPITAL_COMMUNITY): Payer: Self-pay

## 2021-08-10 MED ORDER — MYDAYIS 50 MG PO CP24: 1.0000 | ORAL_CAPSULE | Freq: Every morning | ORAL | 0 refills | Status: AC

## 2021-08-10 MED ORDER — MYDAYIS 50 MG PO CP24
1.0000 | ORAL_CAPSULE | Freq: Every morning | ORAL | 0 refills | Status: AC
  Filled 2021-10-22: qty 30, 30d supply, fill #0

## 2021-08-10 MED ORDER — MYDAYIS 50 MG PO CP24
1.0000 | ORAL_CAPSULE | Freq: Every morning | ORAL | 0 refills | Status: AC
  Filled 2021-08-10: qty 30, 30d supply, fill #0

## 2021-08-23 DIAGNOSIS — R87622 Low grade squamous intraepithelial lesion on cytologic smear of vagina (LGSIL): Secondary | ICD-10-CM | POA: Diagnosis not present

## 2021-08-23 DIAGNOSIS — N879 Dysplasia of cervix uteri, unspecified: Secondary | ICD-10-CM | POA: Diagnosis not present

## 2021-08-23 DIAGNOSIS — R87612 Low grade squamous intraepithelial lesion on cytologic smear of cervix (LGSIL): Secondary | ICD-10-CM | POA: Diagnosis not present

## 2021-08-30 ENCOUNTER — Other Ambulatory Visit (HOSPITAL_COMMUNITY): Payer: Self-pay

## 2021-08-30 MED ORDER — FLUCONAZOLE 150 MG PO TABS
150.0000 mg | ORAL_TABLET | ORAL | 0 refills | Status: AC
  Filled 2021-08-30: qty 1, 1d supply, fill #0

## 2021-10-22 ENCOUNTER — Other Ambulatory Visit (HOSPITAL_COMMUNITY): Payer: Self-pay

## 2021-10-24 ENCOUNTER — Other Ambulatory Visit (HOSPITAL_COMMUNITY): Payer: Self-pay

## 2021-11-19 ENCOUNTER — Other Ambulatory Visit (HOSPITAL_COMMUNITY): Payer: Self-pay

## 2021-11-19 DIAGNOSIS — J45990 Exercise induced bronchospasm: Secondary | ICD-10-CM | POA: Diagnosis not present

## 2021-11-19 DIAGNOSIS — Z0000 Encounter for general adult medical examination without abnormal findings: Secondary | ICD-10-CM | POA: Diagnosis not present

## 2021-11-19 DIAGNOSIS — Z111 Encounter for screening for respiratory tuberculosis: Secondary | ICD-10-CM | POA: Diagnosis not present

## 2021-11-19 DIAGNOSIS — Z13220 Encounter for screening for lipoid disorders: Secondary | ICD-10-CM | POA: Diagnosis not present

## 2021-11-19 DIAGNOSIS — F900 Attention-deficit hyperactivity disorder, predominantly inattentive type: Secondary | ICD-10-CM | POA: Diagnosis not present

## 2021-11-19 MED ORDER — MYDAYIS 50 MG PO CP24
50.0000 mg | ORAL_CAPSULE | Freq: Every morning | ORAL | 0 refills | Status: AC
  Filled 2021-12-26: qty 30, 30d supply, fill #0

## 2021-11-19 MED ORDER — MYDAYIS 50 MG PO CP24: 50.0000 mg | ORAL_CAPSULE | Freq: Every morning | ORAL | 0 refills | Status: AC

## 2021-11-19 MED ORDER — MYDAYIS 50 MG PO CP24
50.0000 mg | ORAL_CAPSULE | Freq: Every morning | ORAL | 0 refills | Status: AC
  Filled 2022-05-14: qty 30, 30d supply, fill #0

## 2021-11-27 ENCOUNTER — Other Ambulatory Visit: Payer: Self-pay | Admitting: Family Medicine

## 2021-11-27 ENCOUNTER — Ambulatory Visit
Admission: RE | Admit: 2021-11-27 | Discharge: 2021-11-27 | Disposition: A | Discharge status: Home / Self Care | Payer: BLUE CROSS/BLUE SHIELD | Source: Ambulatory Visit | Attending: Family Medicine | Admitting: Family Medicine

## 2021-11-27 DIAGNOSIS — R7612 Nonspecific reaction to cell mediated immunity measurement of gamma interferon antigen response without active tuberculosis: Secondary | ICD-10-CM

## 2021-11-27 DIAGNOSIS — R7611 Nonspecific reaction to tuberculin skin test without active tuberculosis: Secondary | ICD-10-CM | POA: Diagnosis not present

## 2021-12-26 ENCOUNTER — Other Ambulatory Visit (HOSPITAL_COMMUNITY): Payer: Self-pay

## 2021-12-27 DIAGNOSIS — D696 Thrombocytopenia, unspecified: Secondary | ICD-10-CM | POA: Diagnosis not present

## 2022-02-22 ENCOUNTER — Other Ambulatory Visit (HOSPITAL_COMMUNITY): Payer: Self-pay

## 2022-02-22 MED ORDER — MYDAYIS 50 MG PO CP24
50.0000 mg | ORAL_CAPSULE | Freq: Every morning | ORAL | 0 refills | Status: AC
  Filled 2022-02-22: qty 30, 30d supply, fill #0

## 2022-03-01 ENCOUNTER — Other Ambulatory Visit (HOSPITAL_COMMUNITY): Payer: Self-pay

## 2022-03-20 DIAGNOSIS — Z01419 Encounter for gynecological examination (general) (routine) without abnormal findings: Secondary | ICD-10-CM | POA: Diagnosis not present

## 2022-03-20 DIAGNOSIS — Z6821 Body mass index (BMI) 21.0-21.9, adult: Secondary | ICD-10-CM | POA: Diagnosis not present

## 2022-03-20 DIAGNOSIS — Z124 Encounter for screening for malignant neoplasm of cervix: Secondary | ICD-10-CM | POA: Diagnosis not present

## 2022-05-14 ENCOUNTER — Other Ambulatory Visit (HOSPITAL_COMMUNITY): Payer: Self-pay

## 2022-05-17 ENCOUNTER — Other Ambulatory Visit (HOSPITAL_COMMUNITY): Payer: Self-pay

## 2022-05-20 ENCOUNTER — Other Ambulatory Visit (HOSPITAL_COMMUNITY): Payer: Self-pay

## 2022-05-22 ENCOUNTER — Other Ambulatory Visit (HOSPITAL_COMMUNITY): Payer: Self-pay

## 2022-05-22 DIAGNOSIS — D696 Thrombocytopenia, unspecified: Secondary | ICD-10-CM | POA: Diagnosis not present

## 2022-05-22 DIAGNOSIS — F900 Attention-deficit hyperactivity disorder, predominantly inattentive type: Secondary | ICD-10-CM | POA: Diagnosis not present

## 2022-05-22 MED ORDER — AMPHET-DEXTROAMPHET 3-BEAD ER 50 MG PO CP24: 50.0000 mg | ORAL_CAPSULE | Freq: Every day | ORAL | 0 refills | Status: AC

## 2022-05-22 MED ORDER — AMPHET-DEXTROAMPHET 3-BEAD ER 50 MG PO CP24
50.0000 mg | ORAL_CAPSULE | Freq: Every day | ORAL | 0 refills | Status: DC
  Filled 2022-05-22: qty 30, 30d supply, fill #0

## 2022-05-22 MED ORDER — AMPHET-DEXTROAMPHET 3-BEAD ER 50 MG PO CP24
50.0000 mg | ORAL_CAPSULE | Freq: Every day | ORAL | 0 refills | Status: AC
  Filled 2022-08-08: qty 30, 30d supply, fill #0

## 2022-05-22 MED ORDER — AMPHET-DEXTROAMPHET 3-BEAD ER 50 MG PO CP24
50.0000 mg | ORAL_CAPSULE | Freq: Every morning | ORAL | 0 refills | Status: DC
  Filled 2022-05-22: qty 30, 30d supply, fill #0

## 2022-05-22 MED ORDER — AMPHET-DEXTROAMPHET 3-BEAD ER 50 MG PO CP24
50.0000 mg | ORAL_CAPSULE | Freq: Every morning | ORAL | 0 refills | Status: AC
  Filled 2022-05-22: qty 30, 30d supply, fill #0

## 2022-05-24 ENCOUNTER — Other Ambulatory Visit (HOSPITAL_COMMUNITY): Payer: Self-pay

## 2022-05-27 ENCOUNTER — Other Ambulatory Visit (HOSPITAL_COMMUNITY): Payer: Self-pay

## 2022-08-08 ENCOUNTER — Other Ambulatory Visit: Payer: Self-pay

## 2022-08-08 ENCOUNTER — Other Ambulatory Visit (HOSPITAL_COMMUNITY): Payer: Self-pay

## 2022-08-09 ENCOUNTER — Other Ambulatory Visit (HOSPITAL_COMMUNITY): Payer: Self-pay

## 2022-08-09 MED ORDER — LISDEXAMFETAMINE DIMESYLATE 40 MG PO CAPS
40.0000 mg | ORAL_CAPSULE | Freq: Every morning | ORAL | 0 refills | Status: AC
  Filled 2022-08-09: qty 30, 30d supply, fill #0

## 2022-08-19 ENCOUNTER — Other Ambulatory Visit (HOSPITAL_COMMUNITY): Payer: Self-pay

## 2022-08-20 ENCOUNTER — Other Ambulatory Visit (HOSPITAL_COMMUNITY): Payer: Self-pay

## 2022-09-04 ENCOUNTER — Other Ambulatory Visit (HOSPITAL_COMMUNITY): Payer: Self-pay

## 2022-09-09 ENCOUNTER — Other Ambulatory Visit (HOSPITAL_COMMUNITY): Payer: Self-pay

## 2022-09-10 ENCOUNTER — Other Ambulatory Visit (HOSPITAL_COMMUNITY): Payer: Self-pay

## 2023-01-26 IMAGING — CR DG CHEST 2V
2 series · 2 of 2 positions shown · non-contrast
Comparison: None Available.

CLINICAL DATA: Positive TB test.

EXAM:
CHEST - 2 VIEW

[w chest pa]
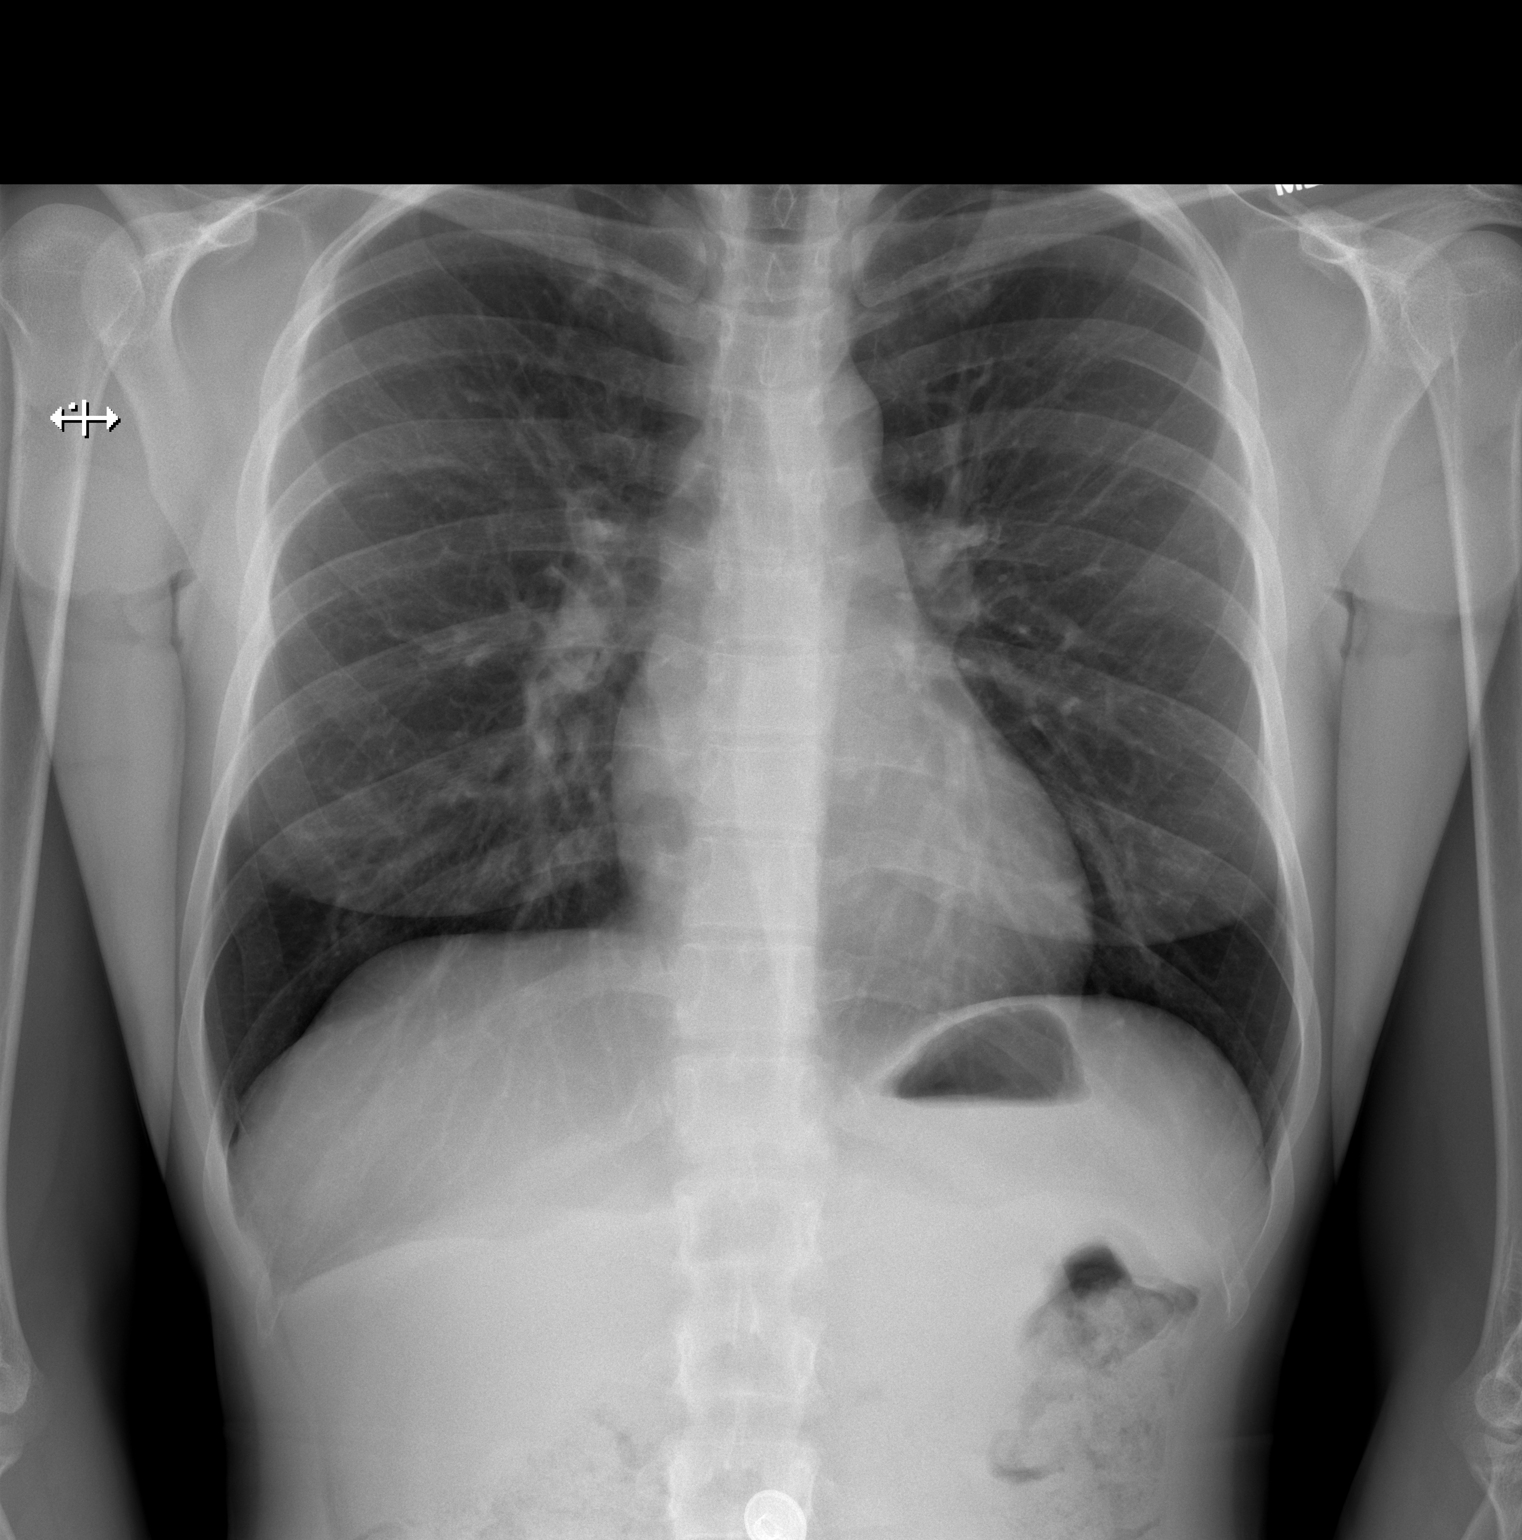

[w chest lat]
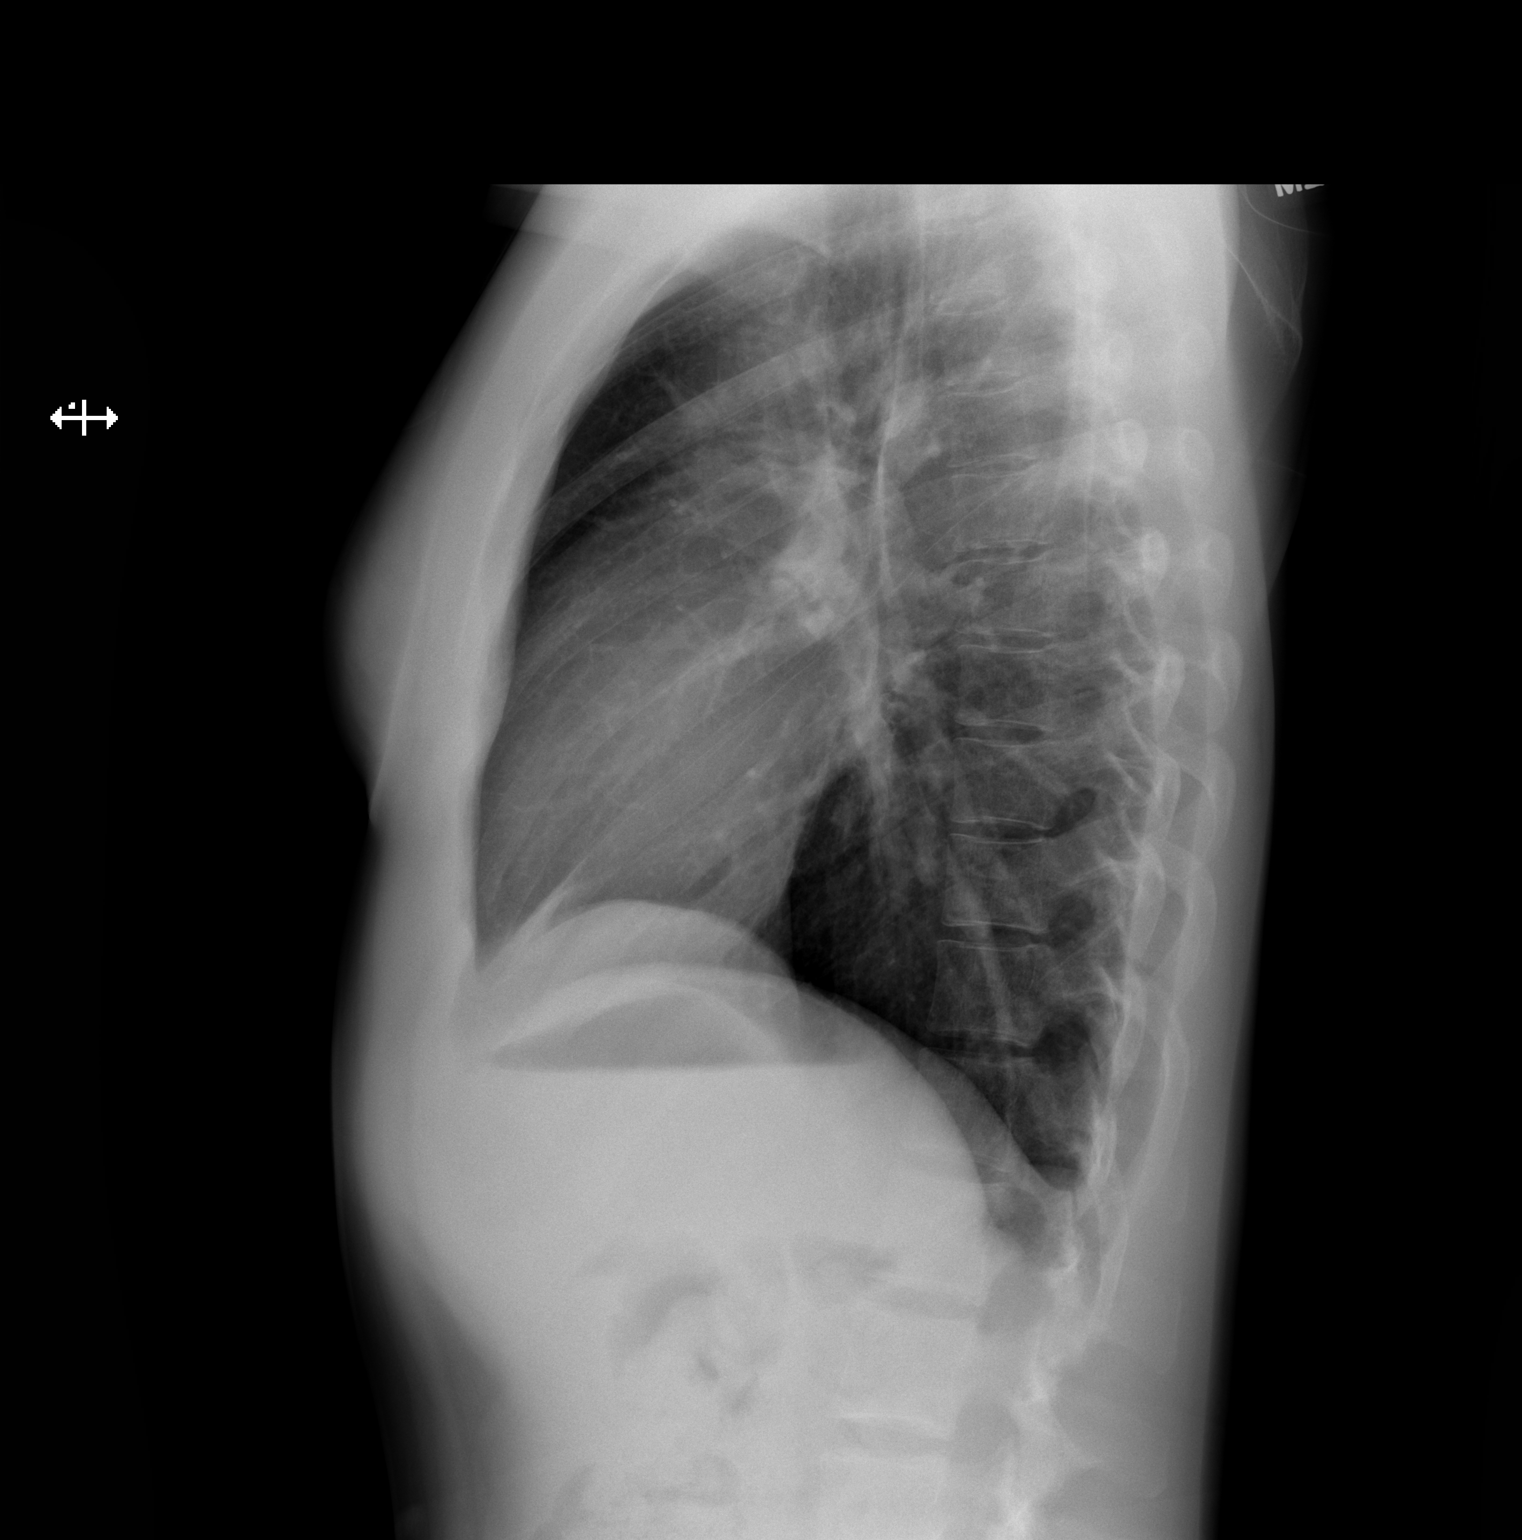

[2 of 2 positions shown; findings below may reference images not displayed]

FINDINGS: The heart size and mediastinal contours are within normal limits.
Both lungs are clear. The visualized skeletal structures are
unremarkable.
IMPRESSION: No active cardiopulmonary disease.
